# Patient Record
Sex: Male | Born: 1960 | Race: Black or African American | Hispanic: No | Smoking: Former smoker
Health system: Southern US, Community
[De-identification: ages and names within clinical notes are randomized; demographics above are authoritative.]

## PROBLEM LIST (undated history)

## (undated) DIAGNOSIS — J45909 Unspecified asthma, uncomplicated: Secondary | ICD-10-CM

---

## 2011-01-02 ENCOUNTER — Emergency Department: Payer: Self-pay

## 2012-05-02 ENCOUNTER — Emergency Department: Payer: Self-pay | Admitting: Emergency Medicine

## 2012-05-31 ENCOUNTER — Emergency Department: Payer: Self-pay | Admitting: Emergency Medicine

## 2013-04-11 ENCOUNTER — Emergency Department: Payer: Self-pay | Admitting: Emergency Medicine

## 2013-04-12 LAB — CBC
HCT: 45.5 % (ref 40.0–52.0)
HGB: 14.7 g/dL (ref 13.0–18.0)
MCH: 23.7 pg — ABNORMAL LOW (ref 26.0–34.0)
MCHC: 32.3 g/dL (ref 32.0–36.0)
MCV: 74 fL — ABNORMAL LOW (ref 80–100)
Platelet: 201 10*3/uL (ref 150–440)
RBC: 6.19 10*6/uL — ABNORMAL HIGH (ref 4.40–5.90)
WBC: 7.2 10*3/uL (ref 3.8–10.6)

## 2013-04-12 LAB — BASIC METABOLIC PANEL
Anion Gap: 4 — ABNORMAL LOW (ref 7–16)
BUN: 13 mg/dL (ref 7–18)
Calcium, Total: 9.4 mg/dL (ref 8.5–10.1)
Chloride: 106 mmol/L (ref 98–107)
Co2: 29 mmol/L (ref 21–32)
Creatinine: 1.12 mg/dL (ref 0.60–1.30)
EGFR (Non-African Amer.): >60
Glucose: 96 mg/dL (ref 65–99)
Potassium: 4.2 mmol/L (ref 3.5–5.1)
Sodium: 139 mmol/L (ref 136–145)

## 2013-04-12 LAB — TROPONIN I: Troponin-I: 0.04 ng/mL

## 2013-04-17 LAB — CULTURE, BLOOD (SINGLE)

## 2014-02-28 IMAGING — CR DG CHEST 2V
1 series · 2 of 2 positions shown · non-contrast
Comparison: none

REASON FOR EXAM: SOB
COMMENTS:

PROCEDURE:     DXR - DXR CHEST PA (OR AP) AND LATERAL  - May 02, 2012 [DATE]
RESULT:     Comparison: None.

[Series 1: pa · 0.17mm/px · 2 of 2 slices shown]
[im 1/2]
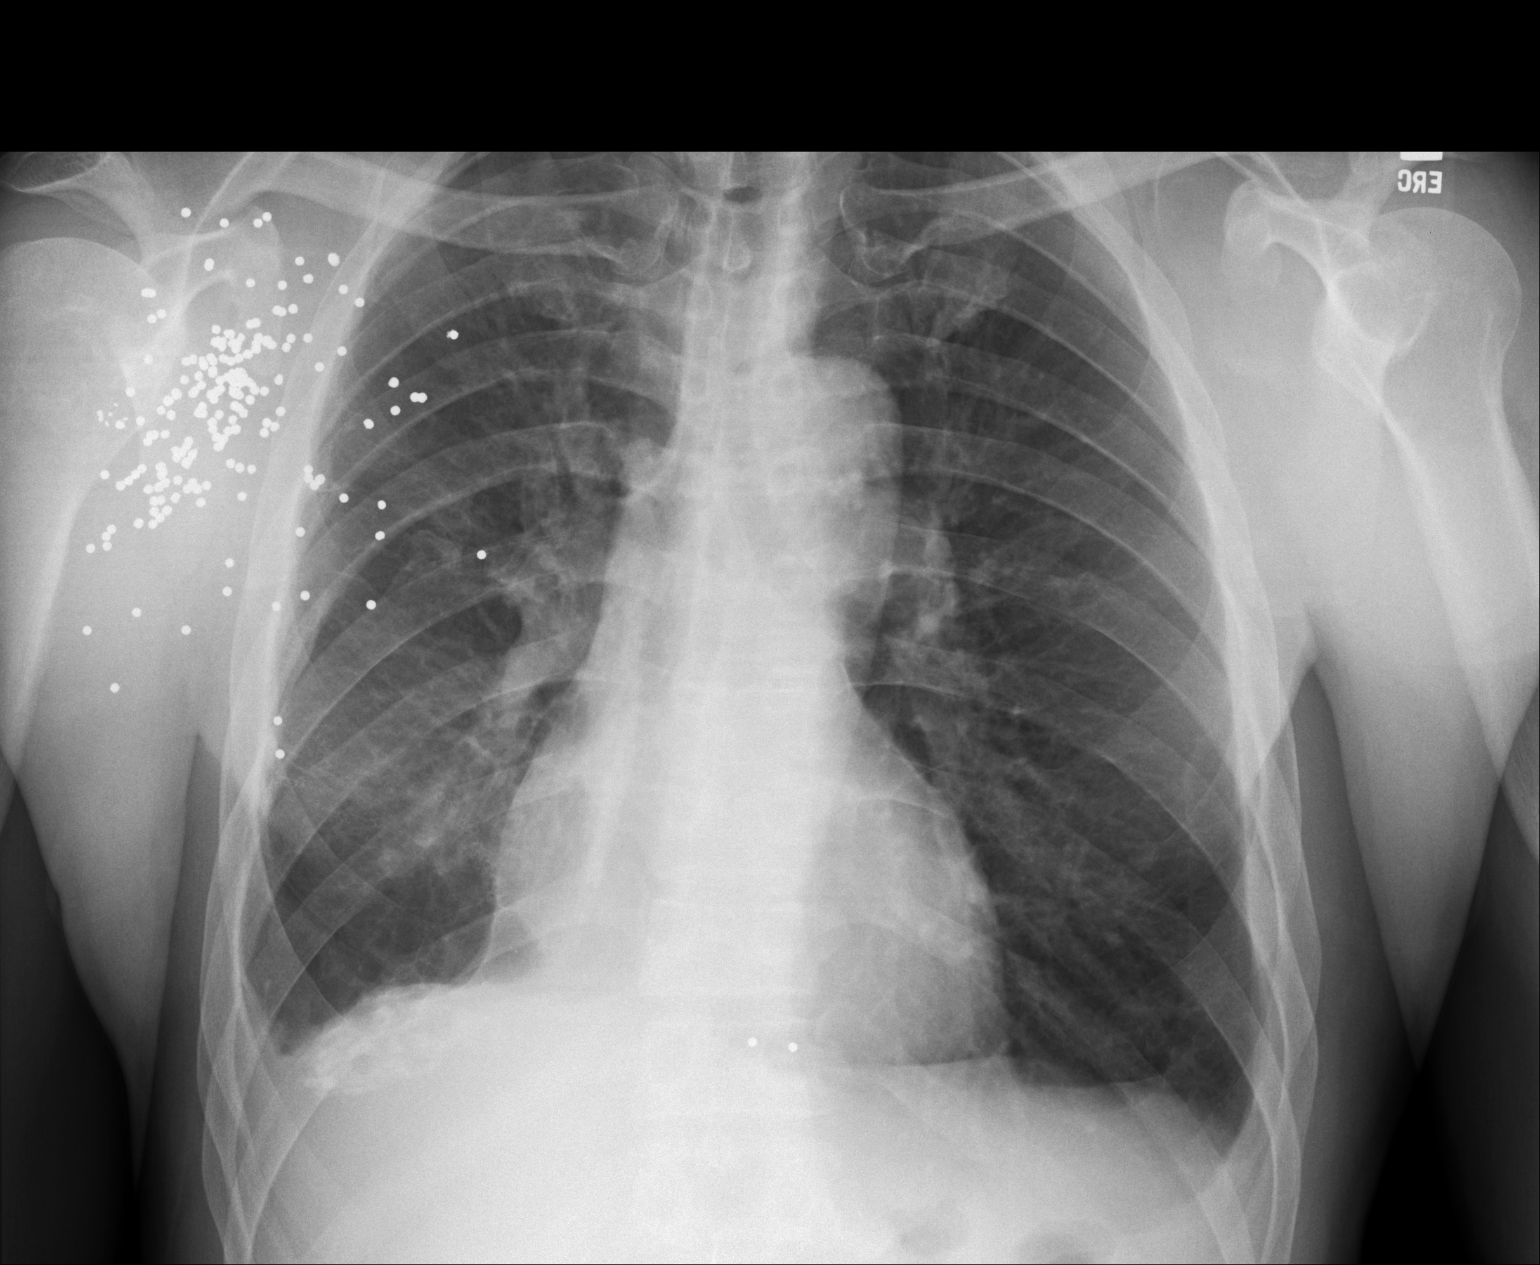
[im 2/2]
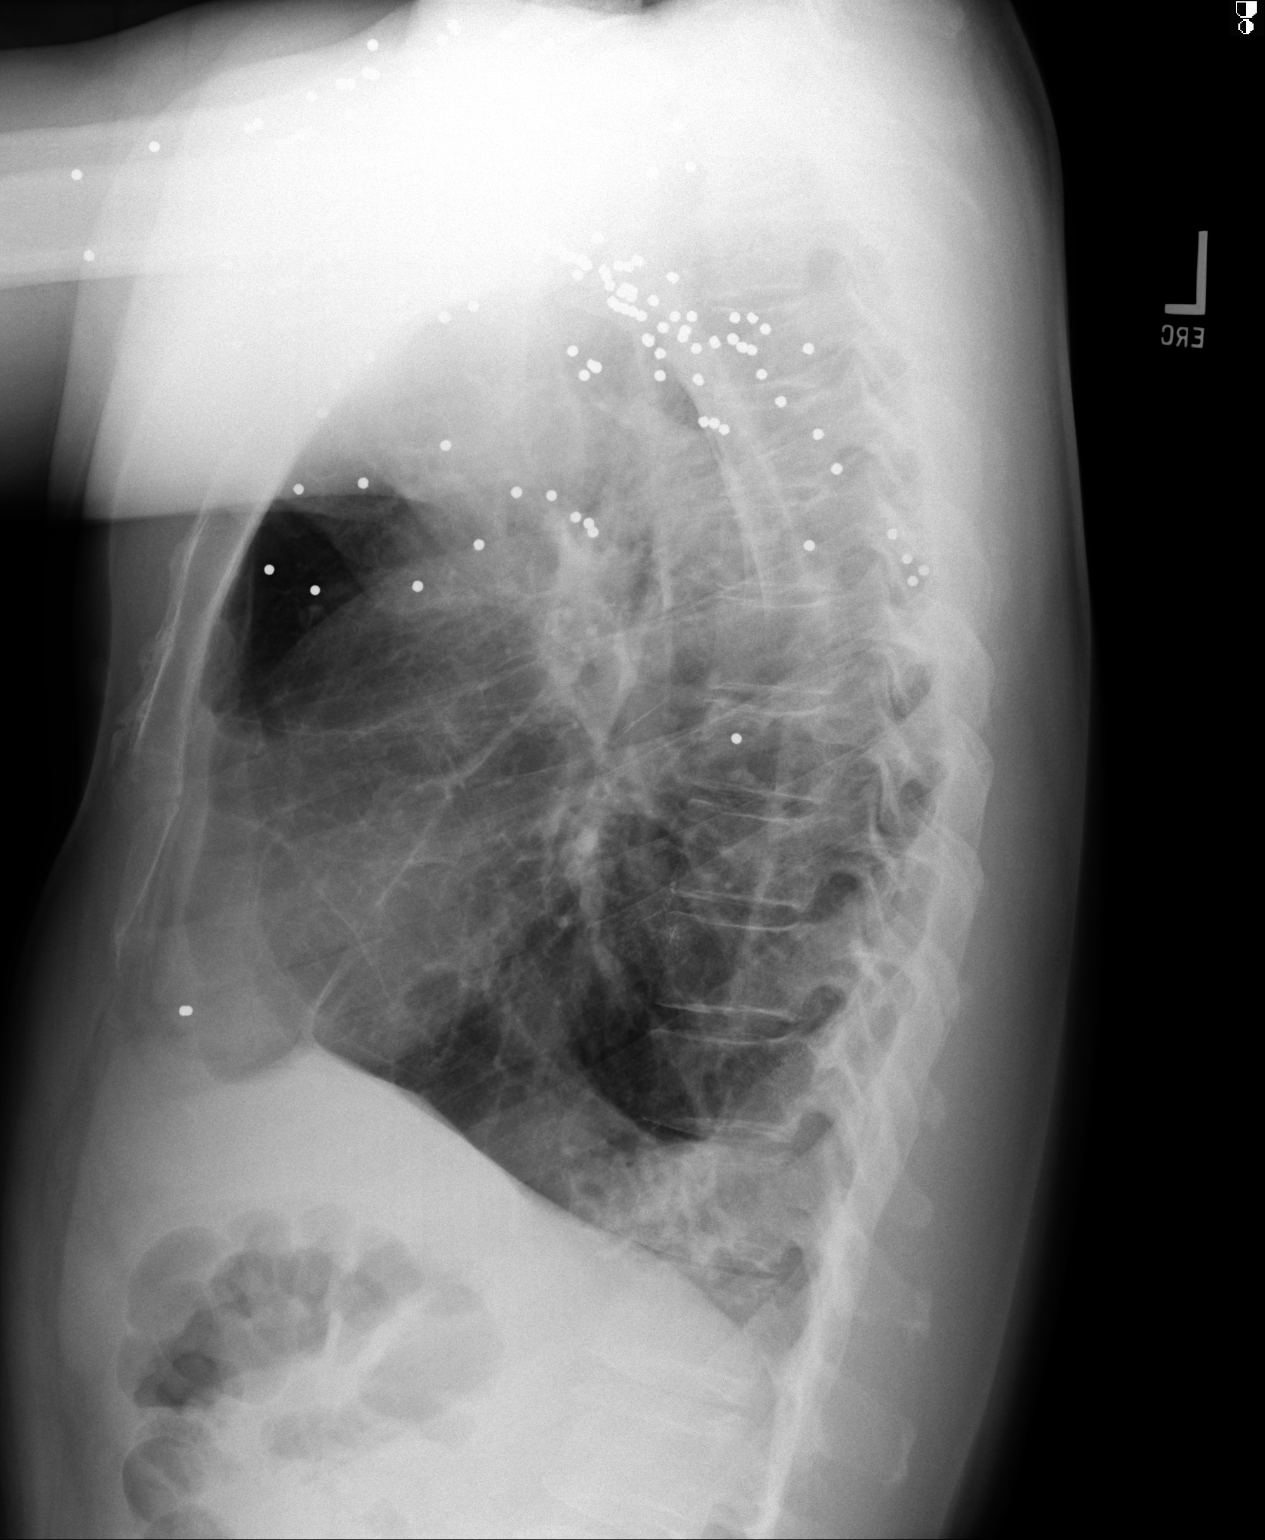

[2 of 2 positions shown; findings below may reference images not displayed]

FINDINGS: The heart is normal in size. There are numerous metallic densities overlying
the right hemithorax and shoulder. There is mild blunting of the right
posterior angle and mild increased density overlying the right
hemidiaphragm. This could the related to some changes of scarring. There are
mild reticular opacities in the right lower lung which may related to
scarring. The left lung is clear.
IMPRESSION: 1. There is mild heterogeneous density along the right hemidiaphragm which
is of uncertain etiology, possibly secondary to scarring or post surgical
change. There is mild adjacent pleural thickening versus small pleural
effusion. Recommend comparison with outside prior chest radiographs to
evaluate for stability of these findings.
2. Mild reticular opacities in the right lower lung may be secondary to
scarring. There appears to be a suture line in this region.
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
## 2015-04-01 ENCOUNTER — Inpatient Hospital Stay
Admit: 2015-04-01 | Discharge: 2015-04-01 | Disposition: A | Discharge status: Home / Self Care | Payer: BLUE CROSS/BLUE SHIELD | Attending: Emergency Medicine

## 2015-04-01 ENCOUNTER — Emergency Department: Admit: 2015-04-01 | Payer: BLUE CROSS/BLUE SHIELD

## 2015-04-01 DIAGNOSIS — J45901 Unspecified asthma with (acute) exacerbation: Secondary | ICD-10-CM

## 2015-04-01 MED ORDER — BENZONATATE 100 MG PO CAPS
100 MG | ORAL_CAPSULE | Freq: Three times a day (TID) | ORAL | 0 refills | Status: AC | PRN
Start: 2015-04-01 — End: 2015-04-08

## 2015-04-01 MED ORDER — PREDNISONE 20 MG PO TABS
20 MG | ORAL_TABLET | Freq: Every day | ORAL | 0 refills | Status: AC
Start: 2015-04-01 — End: 2015-04-06

## 2015-04-01 MED ORDER — ALBUTEROL SULFATE HFA 108 (90 BASE) MCG/ACT IN AERS
108 (90 Base) MCG/ACT | Freq: Four times a day (QID) | RESPIRATORY_TRACT | 3 refills | Status: AC | PRN
Start: 2015-04-01 — End: ?

## 2015-04-01 MED ORDER — METHYLPREDNISOLONE SODIUM SUCC 125 MG IJ SOLR
125 MG | Freq: Once | INTRAMUSCULAR | Status: DC
Start: 2015-04-01 — End: 2015-04-01

## 2015-04-01 MED ORDER — ALBUTEROL SULFATE (2.5 MG/3ML) 0.083% IN NEBU
Freq: Once | RESPIRATORY_TRACT | Status: AC
Start: 2015-04-01 — End: 2015-04-01
  Administered 2015-04-01: 18:00:00 2.5 mg via RESPIRATORY_TRACT

## 2015-04-01 MED ORDER — IPRATROPIUM-ALBUTEROL 0.5-2.5 (3) MG/3ML IN SOLN
Freq: Once | RESPIRATORY_TRACT | Status: AC
Start: 2015-04-01 — End: 2015-04-01
  Administered 2015-04-01: 17:00:00 1 via RESPIRATORY_TRACT

## 2015-04-01 MED ORDER — AZITHROMYCIN 250 MG PO TABS
250 MG | PACK | ORAL | 0 refills | Status: AC
Start: 2015-04-01 — End: 2015-04-11

## 2015-04-01 MED ORDER — METHYLPREDNISOLONE SODIUM SUCC 125 MG IJ SOLR
125 MG | Freq: Once | INTRAMUSCULAR | Status: AC
Start: 2015-04-01 — End: 2015-04-01
  Administered 2015-04-01: 17:00:00 125 mg via INTRAVENOUS

## 2015-04-01 MED FILL — SOLU-MEDROL 125 MG IJ SOLR: 125 MG | INTRAMUSCULAR | Qty: 125

## 2015-04-01 MED FILL — IPRATROPIUM-ALBUTEROL 0.5-2.5 (3) MG/3ML IN SOLN: RESPIRATORY_TRACT | Qty: 3

## 2015-04-01 MED FILL — ALBUTEROL SULFATE (2.5 MG/3ML) 0.083% IN NEBU: RESPIRATORY_TRACT | Qty: 3
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
## 2015-04-01 NOTE — ED Notes (Signed)
Patient placed on 4L of oxygen to raise O2 saturation, it was at 85% on room air, is now 97% on cannula; patient states he has less trouble breathing now.     [address]Mimie Goering Brooke RoscoeDiel, [place]RN  04/01/15 [phone]

## 2015-04-01 NOTE — ED Triage Notes (Signed)
Pt c/o asthma attack since last night, states he is out of his inhaler

## 2015-04-01 NOTE — Discharge Instructions (Signed)
Asthma Attack: Care Instructions  Your Care Instructions     During an asthma attack, the airways swell and narrow. This makes it hard to breathe. Severe asthma attacks can be life-threatening, but you can help prevent them by keeping your asthma under control and treating symptoms before they get bad. Symptoms include being short of breath, having chest tightness, coughing, and wheezing. Noting and treating these symptoms can also help you avoid future trips to the emergency room.  The doctor has checked you carefully, but problems can develop later. If you notice any problems or new symptoms, get medical treatment right away.  Follow-up care is a key part of your treatment and safety. Be sure to make and go to all appointments, and call your doctor if you are having problems. It's also a good idea to know your test results and keep a list of the medicines you take.  How can you care for yourself at home?   Follow your asthma action plan to prevent and treat attacks. If you don't have an asthma action plan, work with your doctor to create one.   Take your asthma medicines exactly as prescribed. Talk to your doctor right away if you have any questions about how to take them.   Use your quick-relief medicine when you have symptoms of an attack. Quick-relief medicine is usually an albuterol inhaler. Some people need to use quick-relief medicine before they exercise.   Take your controller medicine every day, not just when you have symptoms. Controller medicine is usually an inhaled corticosteroid. The goal is to prevent problems before they occur. Don't use your controller medicine to treat an attack that has already started. It doesn't work fast enough to help.   If your doctor prescribed corticosteroid pills to use during an attack, take them exactly as prescribed. It may take hours for the pills to work, but they may make the episode shorter and help you breathe better.   Keep your quick-relief medicine  with you at all times.   Talk to your doctor before using other medicines. Some medicines, such as aspirin, can cause asthma attacks in some people.   If you have a peak flow meter, use it to check how well you are breathing. This can help you predict when an asthma attack is going to occur. Then you can take medicine to prevent the asthma attack or make it less severe.   Do not smoke or allow others to smoke around you. Avoid smoky places. Smoking makes asthma worse. If you need help quitting, talk to your doctor about stop-smoking programs and medicines. These can increase your chances of quitting for good.   Learn what triggers an asthma attack for you, and avoid the triggers when you can. Common triggers include colds, smoke, air pollution, dust, pollen, mold, pets, cockroaches, stress, and cold air.   Avoid colds and the flu. Get a pneumococcal vaccine shot. If you have had one before, ask your doctor if you need a second dose. Get a flu vaccine every fall. If you must be around people with colds or the flu, wash your hands often.  When should you call for help?  Call 911 anytime you think you may need emergency care. For example, call if:   You have severe trouble breathing.  Call your doctor now or seek immediate medical care if:   Your symptoms do not get better after you have followed your asthma action plan.   You have new or worse   trouble breathing.   Your coughing and wheezing get worse.   You cough up dark brown or bloody mucus (sputum).   You have a new or higher fever.  Watch closely for changes in your health, and be sure to contact your doctor if:   You need to use quick-relief medicine on more than 2 days a week (unless it is just for exercise).   You cough more deeply or more often, especially if you notice more mucus or a change in the color of your mucus.   You are not getting better as expected.  Where can you learn more?  Go to [email or web address] and sign in  to your MyChart account. Enter F084 in the Search Health Information box to learn more about "Asthma Attack: Care Instructions."    If you do not have an account, please click on the "Sign Up Now" link.   2006-2016 [employer]. Care instructions adapted under license by [facility]. This care instruction is for use with your licensed healthcare professional. If you have questions about a medical condition or this instruction, always ask your healthcare professional. [employer] disclaims any warranty or liability for your use of this information.  Content Version: 11.0.578772; Current as of: Sep 05, 2014

## 2018-11-29 ENCOUNTER — Other Ambulatory Visit: Payer: Self-pay

## 2018-11-29 ENCOUNTER — Encounter: Payer: Self-pay | Admitting: Intensive Care

## 2018-11-29 ENCOUNTER — Emergency Department: Payer: Self-pay

## 2018-11-29 ENCOUNTER — Inpatient Hospital Stay
Admission: EM | Admit: 2018-11-29 | Discharge: 2018-12-03 | DRG: 189 | Disposition: A | Discharge status: Home / Self Care | Payer: Self-pay | Attending: Internal Medicine | Admitting: Internal Medicine

## 2018-11-29 DIAGNOSIS — J9601 Acute respiratory failure with hypoxia: Principal | ICD-10-CM | POA: Diagnosis present

## 2018-11-29 DIAGNOSIS — I10 Essential (primary) hypertension: Secondary | ICD-10-CM | POA: Diagnosis present

## 2018-11-29 DIAGNOSIS — I959 Hypotension, unspecified: Secondary | ICD-10-CM | POA: Diagnosis present

## 2018-11-29 DIAGNOSIS — J441 Chronic obstructive pulmonary disease with (acute) exacerbation: Secondary | ICD-10-CM | POA: Diagnosis present

## 2018-11-29 DIAGNOSIS — Z885 Allergy status to narcotic agent status: Secondary | ICD-10-CM

## 2018-11-29 DIAGNOSIS — Z888 Allergy status to other drugs, medicaments and biological substances status: Secondary | ICD-10-CM

## 2018-11-29 DIAGNOSIS — Z880 Allergy status to penicillin: Secondary | ICD-10-CM

## 2018-11-29 DIAGNOSIS — E875 Hyperkalemia: Secondary | ICD-10-CM | POA: Diagnosis present

## 2018-11-29 DIAGNOSIS — Z20828 Contact with and (suspected) exposure to other viral communicable diseases: Secondary | ICD-10-CM | POA: Diagnosis present

## 2018-11-29 HISTORY — DX: Unspecified asthma, uncomplicated: J45.909

## 2018-11-29 LAB — CBC WITH DIFFERENTIAL/PLATELET
Abs Immature Granulocytes: 0.01 10*3/uL (ref 0.00–0.07)
Basophils Absolute: 0 10*3/uL (ref 0.0–0.1)
Basophils Relative: 1 %
Eosinophils Absolute: 0.3 10*3/uL (ref 0.0–0.5)
Eosinophils Relative: 4 %
HCT: 52.3 % — ABNORMAL HIGH (ref 39.0–52.0)
Hemoglobin: 15.3 g/dL (ref 13.0–17.0)
Immature Granulocytes: 0 %
Lymphocytes Relative: 12 %
Lymphs Abs: 0.8 10*3/uL (ref 0.7–4.0)
MCH: 23 pg — ABNORMAL LOW (ref 26.0–34.0)
MCHC: 29.3 g/dL — ABNORMAL LOW (ref 30.0–36.0)
MCV: 78.8 fL — ABNORMAL LOW (ref 80.0–100.0)
Monocytes Absolute: 0.8 10*3/uL (ref 0.1–1.0)
Monocytes Relative: 12 %
Neutro Abs: 4.5 10*3/uL (ref 1.7–7.7)
Neutrophils Relative %: 71 %
Platelets: 218 10*3/uL (ref 150–400)
RBC: 6.64 MIL/uL — ABNORMAL HIGH (ref 4.22–5.81)
RDW: 19.2 % — ABNORMAL HIGH (ref 11.5–15.5)
WBC: 6.4 10*3/uL (ref 4.0–10.5)
nRBC: 0 % (ref 0.0–0.2)

## 2018-11-29 LAB — COMPREHENSIVE METABOLIC PANEL
ALT: 14 U/L (ref 0–44)
AST: 15 U/L (ref 15–41)
Albumin: 4.3 g/dL (ref 3.5–5.0)
Alkaline Phosphatase: 62 U/L (ref 38–126)
Anion gap: 9 (ref 5–15)
BUN: 11 mg/dL (ref 6–20)
CO2: 31 mmol/L (ref 22–32)
Calcium: 9 mg/dL (ref 8.9–10.3)
Chloride: 99 mmol/L (ref 98–111)
Creatinine, Ser: 0.92 mg/dL (ref 0.61–1.24)
GFR calc Af Amer: >60 mL/min (ref >60)
GFR calc non Af Amer: >60 mL/min (ref >60)
Glucose, Bld: 105 mg/dL — ABNORMAL HIGH (ref 70–99)
Potassium: 4.2 mmol/L (ref 3.5–5.1)
Sodium: 139 mmol/L (ref 135–145)
Total Bilirubin: 0.5 mg/dL (ref 0.3–1.2)
Total Protein: 7.2 g/dL (ref 6.5–8.1)

## 2018-11-29 LAB — BRAIN NATRIURETIC PEPTIDE: B Natriuretic Peptide: 63 pg/mL (ref 0.0–100.0)

## 2018-11-29 LAB — TROPONIN I (HIGH SENSITIVITY)
Troponin I (High Sensitivity): 8 ng/L (ref <18)
Troponin I (High Sensitivity): 9 ng/L (ref <18)

## 2018-11-29 LAB — PROCALCITONIN: Procalcitonin: <0.1 ng/mL

## 2018-11-29 LAB — LACTIC ACID, PLASMA: Lactic Acid, Venous: 0.8 mmol/L (ref 0.5–1.9)

## 2018-11-29 LAB — SARS CORONAVIRUS 2 BY RT PCR (HOSPITAL ORDER, PERFORMED IN ~~LOC~~ HOSPITAL LAB): SARS Coronavirus 2: NEGATIVE

## 2018-11-29 MED ORDER — ONDANSETRON HCL 4 MG/2ML IJ SOLN
4.0000 mg | Freq: Four times a day (QID) | INTRAMUSCULAR | Status: DC | PRN
  Administered 2018-11-30 (×2): 4 mg via INTRAVENOUS
  Filled 2018-11-29 (×2): qty 2

## 2018-11-29 MED ORDER — SODIUM CHLORIDE 0.9 % IV SOLN
500.0000 mg | Freq: Once | INTRAVENOUS | Status: AC
  Administered 2018-11-29: 500 mg via INTRAVENOUS
  Filled 2018-11-29: qty 500

## 2018-11-29 MED ORDER — ACETAMINOPHEN 650 MG RE SUPP: 650.0000 mg | Freq: Four times a day (QID) | RECTAL | Status: DC | PRN

## 2018-11-29 MED ORDER — UMECLIDINIUM BROMIDE 62.5 MCG/INH IN AEPB
1.0000 | INHALATION_SPRAY | Freq: Every day | RESPIRATORY_TRACT | Status: DC
  Administered 2018-11-30 – 2018-12-03 (×4): 1 via RESPIRATORY_TRACT
  Filled 2018-11-29: qty 7

## 2018-11-29 MED ORDER — IPRATROPIUM-ALBUTEROL 0.5-2.5 (3) MG/3ML IN SOLN
3.0000 mL | Freq: Four times a day (QID) | RESPIRATORY_TRACT | Status: DC
  Administered 2018-11-29 – 2018-12-02 (×12): 3 mL via RESPIRATORY_TRACT
  Filled 2018-11-29 (×12): qty 3

## 2018-11-29 MED ORDER — ACETAMINOPHEN 325 MG PO TABS
650.0000 mg | ORAL_TABLET | Freq: Four times a day (QID) | ORAL | Status: DC | PRN
  Administered 2018-12-01: 650 mg via ORAL
  Filled 2018-11-29: qty 2

## 2018-11-29 MED ORDER — AZITHROMYCIN 250 MG PO TABS
500.0000 mg | ORAL_TABLET | Freq: Every day | ORAL | Status: DC
  Administered 2018-11-30 – 2018-12-03 (×4): 500 mg via ORAL
  Filled 2018-11-29 (×4): qty 2

## 2018-11-29 MED ORDER — BRIMONIDINE TARTRATE 0.2 % OP SOLN
1.0000 [drp] | Freq: Two times a day (BID) | OPHTHALMIC | Status: DC
  Administered 2018-12-03: 1 [drp] via OPHTHALMIC
  Filled 2018-11-29 (×3): qty 5

## 2018-11-29 MED ORDER — POLYETHYLENE GLYCOL 3350 17 G PO PACK: 17.0000 g | PACK | Freq: Every day | ORAL | Status: DC | PRN

## 2018-11-29 MED ORDER — SODIUM CHLORIDE 0.9 % IV SOLN
2.0000 g | Freq: Once | INTRAVENOUS | Status: AC
  Administered 2018-11-29: 2 g via INTRAVENOUS
  Filled 2018-11-29: qty 20

## 2018-11-29 MED ORDER — MONTELUKAST SODIUM 10 MG PO TABS
10.0000 mg | ORAL_TABLET | Freq: Every evening | ORAL | Status: DC
  Administered 2018-11-29 – 2018-12-03 (×4): 10 mg via ORAL
  Filled 2018-11-29 (×5): qty 1

## 2018-11-29 MED ORDER — IPRATROPIUM-ALBUTEROL 0.5-2.5 (3) MG/3ML IN SOLN
3.0000 mL | Freq: Once | RESPIRATORY_TRACT | Status: AC
  Administered 2018-11-29: 14:00:00 3 mL via RESPIRATORY_TRACT
  Filled 2018-11-29: qty 3

## 2018-11-29 MED ORDER — ALBUTEROL SULFATE (2.5 MG/3ML) 0.083% IN NEBU
5.0000 mg | INHALATION_SOLUTION | Freq: Once | RESPIRATORY_TRACT | Status: AC
  Administered 2018-11-29: 5 mg via RESPIRATORY_TRACT
  Filled 2018-11-29: qty 6

## 2018-11-29 MED ORDER — ONDANSETRON HCL 4 MG PO TABS: 4.0000 mg | ORAL_TABLET | Freq: Four times a day (QID) | ORAL | Status: DC | PRN

## 2018-11-29 MED ORDER — FLUTICASONE-UMECLIDIN-VILANT 100-62.5-25 MCG/INH IN AEPB: 1.0000 | INHALATION_SPRAY | Freq: Every day | RESPIRATORY_TRACT | Status: DC

## 2018-11-29 MED ORDER — LATANOPROST 0.005 % OP SOLN
1.0000 [drp] | Freq: Every day | OPHTHALMIC | Status: DC
  Administered 2018-11-30 – 2018-12-02 (×3): 1 [drp] via OPHTHALMIC
  Filled 2018-11-29 (×2): qty 2.5

## 2018-11-29 MED ORDER — FUROSEMIDE 10 MG/ML IJ SOLN
20.0000 mg | Freq: Once | INTRAMUSCULAR | Status: AC
  Administered 2018-11-29: 20 mg via INTRAVENOUS
  Filled 2018-11-29: qty 4

## 2018-11-29 MED ORDER — FLUTICASONE FUROATE-VILANTEROL 100-25 MCG/INH IN AEPB
1.0000 | INHALATION_SPRAY | Freq: Every day | RESPIRATORY_TRACT | Status: DC
  Administered 2018-11-30 – 2018-12-03 (×4): 1 via RESPIRATORY_TRACT
  Filled 2018-11-29: qty 28

## 2018-11-29 MED ORDER — ALBUTEROL SULFATE (2.5 MG/3ML) 0.083% IN NEBU: 2.5000 mg | INHALATION_SOLUTION | RESPIRATORY_TRACT | Status: DC | PRN

## 2018-11-29 MED ORDER — METHYLPREDNISOLONE SODIUM SUCC 125 MG IJ SOLR
60.0000 mg | Freq: Two times a day (BID) | INTRAMUSCULAR | Status: DC
  Administered 2018-11-30 – 2018-12-02 (×6): 60 mg via INTRAVENOUS
  Filled 2018-11-29 (×6): qty 2

## 2018-11-29 MED ORDER — ENOXAPARIN SODIUM 40 MG/0.4ML ~~LOC~~ SOLN
40.0000 mg | SUBCUTANEOUS | Status: DC
  Administered 2018-11-29 – 2018-12-02 (×4): 40 mg via SUBCUTANEOUS
  Filled 2018-11-29 (×4): qty 0.4

## 2018-11-29 MED ORDER — METHYLPREDNISOLONE SODIUM SUCC 125 MG IJ SOLR
125.0000 mg | Freq: Once | INTRAMUSCULAR | Status: AC
  Administered 2018-11-29: 125 mg via INTRAVENOUS
  Filled 2018-11-29: qty 2
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
## 2018-11-29 NOTE — ED Triage Notes (Signed)
Patient c/o cough, wheezing, headache, and sob since yesterday. O2 sats in triage 78%

## 2018-11-29 NOTE — ED Provider Notes (Addendum)
[facility] Emergency Department Provider Note  ____________________________________________  Time seen: Approximately 2:20 PM  I have reviewed the triage vital signs and the nursing notes.   HISTORY  Chief Complaint Cough, Wheezing, and Headache    HPI Ryan Harrison is a 58 y.o. male with a history of COPD who comes the ED complaining of 2 days worth of worsening shortness of breath associated with nonproductive cough.  No fevers chills body aches or sweats.  No sick contacts.  No chest pain.  Tried using his inhalers without relief.  Symptoms are constant, worse with exertion, no alleviating factors.  Patient works as a Magazine features editor.  He reports some chronic pedal edema that is dependent and gets better with leg elevation at night.      Past Medical History:  Diagnosis Date  . Asthma   COPD   There are no active problems to display for this patient.    History reviewed. No pertinent surgical history.   Prior to Admission medications   Not on File  Albuterol inhaler   Allergies Benadryl [diphenhydramine] and Penicillins   History reviewed. No pertinent family history.  Social History Social History   Tobacco Use  . Smoking status: Never Smoker  . Smokeless tobacco: Never Used  Substance Use Topics  . Alcohol use: Not Currently  . Drug use: Never    Review of Systems  Constitutional:   No fever or chills.  ENT:   No sore throat. No rhinorrhea. Cardiovascular:   No chest pain or syncope. Respiratory:   Positive shortness of breath and nonproductive cough. Gastrointestinal:   Negative for abdominal pain, vomiting and diarrhea.  Musculoskeletal:   Negative for focal pain, positive chronic pedal edema bilaterally All other systems reviewed and are negative except as documented above in ROS and HPI.  ____________________________________________   PHYSICAL EXAM:  VITAL SIGNS: ED Triage Vitals  Enc Vitals Group      BP 11/29/18 1304 (!) 147/80     Pulse Rate 11/29/18 1304 100     Resp 11/29/18 1304 (!) 28     Temp 11/29/18 1304 98.2 F (36.8 C)     Temp Source 11/29/18 1304 Oral     SpO2 11/29/18 1304 (!) 78 %     Weight 11/29/18 1305 189 lb (85.7 kg)     Height 11/29/18 1305 6\' 2"  (1.88 m)     Head Circumference --      Peak Flow --      Pain Score 11/29/18 1305 6     Pain Loc --      Pain Edu? --      Excl. in [place]? --     Vital signs reviewed, nursing assessments reviewed.   Constitutional:   Alert and oriented. Non-toxic appearance. Eyes:   Conjunctivae are normal. EOMI. PERRL. ENT      Head:   Normocephalic and atraumatic.      Nose:   No congestion/rhinnorhea.       Mouth/Throat:   MMM, no pharyngeal erythema. No peritonsillar mass.       Neck:   No meningismus. Full ROM. Hematological/Lymphatic/Immunilogical:   No cervical lymphadenopathy. Cardiovascular:   RRR. Symmetric bilateral radial and DP pulses.  No murmurs. Cap refill less than 2 seconds. Respiratory:   Tachypnea.  No accessory muscle use.  Diffuse expiratory wheezing.  Markedly prolonged expiratory phase.  No focal crackles. Gastrointestinal:   Soft and nontender. Non distended. There is no CVA tenderness.  No  rebound, rigidity, or guarding.  Musculoskeletal:   Normal range of motion in all extremities. No joint effusions.  No lower extremity tenderness.  1+ pitting edema at bilateral ankles.  Calves nontender, symmetric calf circumference, negative Homans sign.  No palpable cord.. Neurologic:   Normal speech and language.  Motor grossly intact. No acute focal neurologic deficits are appreciated.  Skin:    Skin is warm, dry and intact. No rash noted.  No petechiae, purpura, or bullae.  ____________________________________________    LABS (pertinent positives/negatives) (all labs ordered are listed, but only abnormal results are displayed) Labs Reviewed  COMPREHENSIVE METABOLIC PANEL - Abnormal; Notable for the  following components:      Result Value   Glucose, Bld 105 (*)    All other components within normal limits  CBC WITH DIFFERENTIAL/PLATELET - Abnormal; Notable for the following components:   RBC 6.64 (*)    HCT 52.3 (*)    MCV 78.8 (*)    MCH 23.0 (*)    MCHC 29.3 (*)    RDW 19.2 (*)    All other components within normal limits  SARS CORONAVIRUS 2 (HOSPITAL ORDER, PERFORMED IN [facility])  LACTIC ACID, PLASMA  LACTIC ACID, PLASMA  TROPONIN I (HIGH SENSITIVITY)   ____________________________________________   EKG Interpreted by me Sinus rhythm rate of 91, normal axis intervals QRS ST segments and T waves.   ____________________________________________    RADIOLOGY  No results found.  ____________________________________________   PROCEDURES .Critical Care Performed by: Sharman CheekStafford, Tanyla Stege, MD Authorized by: Sharman CheekStafford, Shawanda Sievert, MD   Critical care provider statement:    Critical care time (minutes):  30   Critical care time was exclusive of:  Separately billable procedures and treating other patients   Critical care was necessary to treat or prevent imminent or life-threatening deterioration of the following conditions:  Respiratory failure   Critical care was time spent personally by me on the following activities:  Development of treatment plan with patient or surrogate, discussions with consultants, evaluation of patient's response to treatment, examination of patient, obtaining history from patient or surrogate, ordering and performing treatments and interventions, ordering and review of laboratory studies, ordering and review of radiographic studies, pulse oximetry, re-evaluation of patient's condition and review of old charts    ____________________________________________  DIFFERENTIAL DIAGNOSIS   Pneumonia, COPD exacerbation, COVID-19, pulmonary embolism  CLINICAL IMPRESSION / ASSESSMENT AND PLAN / ED COURSE  Medications ordered in the  ED: Medications  cefTRIAXone (ROCEPHIN) 2 g in sodium chloride 0.9 % 100 mL IVPB (2 g Intravenous New Bag/Given 11/29/18 1408)  azithromycin (ZITHROMAX) 500 mg in sodium chloride 0.9 % 250 mL IVPB (has no administration in time range)  methylPREDNISolone sodium succinate (SOLU-MEDROL) 125 mg/2 mL injection 125 mg (125 mg Intravenous Given 11/29/18 1403)  ipratropium-albuterol (DUONEB) 0.5-2.5 (3) MG/3ML nebulizer solution 3 mL (3 mLs Nebulization Given 11/29/18 1416)  albuterol (PROVENTIL) (2.5 MG/3ML) 0.083% nebulizer solution 5 mg (5 mg Nebulization Given 11/29/18 1416)    Pertinent labs & imaging results that were available during my care of the patient were reviewed by me and considered in my medical decision making (see chart for details).  Campbell RichesFrederick L Fern was evaluated in Emergency Department on 11/29/2018 for the symptoms described in the history of present illness. He was evaluated in the context of the global COVID-19 pandemic, which necessitated consideration that the patient might be at risk for infection with the SARS-CoV-2 virus that causes COVID-19. Institutional protocols and algorithms that pertain to  the evaluation of patients at risk for COVID-19 are in a state of rapid change based on information released by regulatory bodies including the CDC and federal and state organizations. These policies and algorithms were followed during the patient's care in the ED.   Patient presents with shortness of breath, nonproductive cough and pulmonary exam findings prolonged expiratory phase and wheezing all consistent with COPD exacerbation.  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  Stabilized on nasal cannula oxygen.  Will give Solu-Medrol and nebs.  If work-up is not confirming the clinical exam diagnosis of COPD or COVID, patient may need CT angiogram to evaluate for PE.  Will plan for admission due to his acute hypoxic respiratory  failure.  ----------------------------------------- 2:56 PM on 11/29/2018 -----------------------------------------  cxr image viewed by me, shows hazy infiltrates in right lower lung.  Radiology read describes possible pulmonary edema, but appearance could be due to viral pneumonitis as well.  Presentation is not consistent with PE and would not proceed with CT scan.  Hospitalist paged for further management.  I will add on IV Lasix 20 mg for initial diuresis in the setting of his pedal edema..  Troponin is unremarkable.     ____________________________________________   FINAL CLINICAL IMPRESSION(S) / ED DIAGNOSES    Final diagnoses:  Acute respiratory failure with hypoxia (HCC)  COPD exacerbation [facility](HCC)     ED Discharge Orders    None      Portions of this note were generated with dragon dictation software. Dictation errors may occur despite best attempts at proofreading.   Sharman CheekStafford, Justyce Baby, MD 11/29/18 1429    Sharman CheekStafford, Munachimso Rigdon, MD 11/29/18 1457    Sharman CheekStafford, Kooper Chriswell, MD 11/29/18 [phone]

## 2018-11-30 LAB — BASIC METABOLIC PANEL
Anion gap: 15 (ref 5–15)
BUN: 15 mg/dL (ref 6–20)
CO2: 42 mmol/L — ABNORMAL HIGH (ref 22–32)
Calcium: 9.5 mg/dL (ref 8.9–10.3)
Chloride: 85 mmol/L — ABNORMAL LOW (ref 98–111)
Creatinine, Ser: 1.17 mg/dL (ref 0.61–1.24)
GFR calc Af Amer: >60 mL/min (ref >60)
GFR calc non Af Amer: >60 mL/min (ref >60)
Glucose, Bld: 126 mg/dL — ABNORMAL HIGH (ref 70–99)
Potassium: 6.1 mmol/L — ABNORMAL HIGH (ref 3.5–5.1)
Sodium: 142 mmol/L (ref 135–145)

## 2018-11-30 LAB — CBC
HCT: 58.6 % — ABNORMAL HIGH (ref 39.0–52.0)
Hemoglobin: 16.4 g/dL (ref 13.0–17.0)
MCH: 22.9 pg — ABNORMAL LOW (ref 26.0–34.0)
MCHC: 28 g/dL — ABNORMAL LOW (ref 30.0–36.0)
MCV: 81.8 fL (ref 80.0–100.0)
Platelets: 236 10*3/uL (ref 150–400)
RBC: 7.16 MIL/uL — ABNORMAL HIGH (ref 4.22–5.81)
RDW: 19.9 % — ABNORMAL HIGH (ref 11.5–15.5)
WBC: 6.4 10*3/uL (ref 4.0–10.5)
nRBC: 0 % (ref 0.0–0.2)

## 2018-11-30 LAB — POTASSIUM
Potassium: 6.4 mmol/L (ref 3.5–5.1)
Potassium: 5.4 mmol/L — ABNORMAL HIGH (ref 3.5–5.1)

## 2018-11-30 MED ORDER — SODIUM POLYSTYRENE SULFONATE 15 GM/60ML PO SUSP
30.0000 g | Freq: Once | ORAL | Status: AC
  Administered 2018-11-30: 30 g via ORAL
  Filled 2018-11-30: qty 120

## 2018-11-30 MED ORDER — CALCIUM GLUCONATE-NACL 1-0.675 GM/50ML-% IV SOLN
1.0000 g | Freq: Once | INTRAVENOUS | Status: AC
  Administered 2018-11-30: 1000 mg via INTRAVENOUS
  Filled 2018-11-30: qty 50

## 2018-11-30 MED ORDER — SODIUM CHLORIDE 0.9 % IV SOLN
INTRAVENOUS | Status: DC | PRN
  Administered 2018-11-30: 09:00:00 250 mL via INTRAVENOUS

## 2018-11-30 MED ORDER — SODIUM BICARBONATE 8.4 % IV SOLN
50.0000 meq | Freq: Once | INTRAVENOUS | Status: AC
  Administered 2018-11-30: 50 meq via INTRAVENOUS
  Filled 2018-11-30: qty 50

## 2018-11-30 MED ORDER — SODIUM ZIRCONIUM CYCLOSILICATE 10 G PO PACK
10.0000 g | PACK | Freq: Once | ORAL | Status: AC
  Administered 2018-11-30: 10 g via ORAL
  Filled 2018-11-30: qty 1

## 2018-11-30 MED ORDER — DEXTROSE 50 % IV SOLN
50.0000 mL | Freq: Once | INTRAVENOUS | Status: AC
  Administered 2018-11-30: 50 mL via INTRAVENOUS
  Filled 2018-11-30: qty 50

## 2018-11-30 MED ORDER — INSULIN REGULAR BOLUS VIA INFUSION
10.0000 [IU] | Freq: Once | INTRAVENOUS | Status: AC
  Administered 2018-11-30: 10 [IU] via INTRAVENOUS
  Filled 2018-11-30 (×3): qty 10

## 2018-11-30 MED ORDER — ALUM & MAG HYDROXIDE-SIMETH 200-200-20 MG/5ML PO SUSP
30.0000 mL | Freq: Four times a day (QID) | ORAL | Status: DC | PRN
  Administered 2018-11-30: 30 mL via ORAL
  Filled 2018-11-30: qty 30
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
## 2018-12-01 ENCOUNTER — Inpatient Hospital Stay (HOSPITAL_COMMUNITY)
Admit: 2018-12-01 | Discharge: 2018-12-01 | Disposition: A | Discharge status: Home / Self Care | Payer: PRIVATE HEALTH INSURANCE | Attending: Internal Medicine | Admitting: Internal Medicine

## 2018-12-01 DIAGNOSIS — R0602 Shortness of breath: Secondary | ICD-10-CM | POA: Diagnosis not present

## 2018-12-01 LAB — CBC
HCT: 51.7 % (ref 39.0–52.0)
Hemoglobin: 14.1 g/dL (ref 13.0–17.0)
MCH: 22.9 pg — ABNORMAL LOW (ref 26.0–34.0)
MCHC: 27.3 g/dL — ABNORMAL LOW (ref 30.0–36.0)
MCV: 84.1 fL (ref 80.0–100.0)
Platelets: 209 10*3/uL (ref 150–400)
RBC: 6.15 MIL/uL — ABNORMAL HIGH (ref 4.22–5.81)
RDW: 18.9 % — ABNORMAL HIGH (ref 11.5–15.5)
WBC: 10.4 10*3/uL (ref 4.0–10.5)
nRBC: 0.2 % (ref 0.0–0.2)

## 2018-12-01 LAB — BASIC METABOLIC PANEL
Anion gap: 6 (ref 5–15)
BUN: 22 mg/dL — ABNORMAL HIGH (ref 6–20)
CO2: 39 mmol/L — ABNORMAL HIGH (ref 22–32)
Calcium: 8.8 mg/dL — ABNORMAL LOW (ref 8.9–10.3)
Chloride: 91 mmol/L — ABNORMAL LOW (ref 98–111)
Creatinine, Ser: 0.91 mg/dL (ref 0.61–1.24)
GFR calc Af Amer: >60 mL/min (ref >60)
GFR calc non Af Amer: >60 mL/min (ref >60)
Glucose, Bld: 132 mg/dL — ABNORMAL HIGH (ref 70–99)
Potassium: 5.5 mmol/L — ABNORMAL HIGH (ref 3.5–5.1)
Sodium: 136 mmol/L (ref 135–145)

## 2018-12-01 LAB — HIV ANTIBODY (ROUTINE TESTING W REFLEX): HIV Screen 4th Generation wRfx: NONREACTIVE

## 2018-12-01 LAB — ECHOCARDIOGRAM COMPLETE
Height: 74 in
Weight: 3024 oz

## 2018-12-01 LAB — MAGNESIUM: Magnesium: 2.4 mg/dL (ref 1.7–2.4)

## 2018-12-01 MED ORDER — BRINZOLAMIDE 1 % OP SUSP
1.0000 [drp] | Freq: Two times a day (BID) | OPHTHALMIC | Status: DC
  Administered 2018-12-01 – 2018-12-03 (×4): 1 [drp] via OPHTHALMIC
  Filled 2018-12-01: qty 10

## 2018-12-01 MED ORDER — TRAZODONE HCL 100 MG PO TABS
100.0000 mg | ORAL_TABLET | Freq: Every day | ORAL | Status: DC
  Administered 2018-12-01 – 2018-12-02 (×2): 100 mg via ORAL
  Filled 2018-12-01 (×2): qty 1

## 2018-12-01 MED ORDER — BRIMONIDINE TARTRATE 0.2 % OP SOLN
1.0000 [drp] | Freq: Two times a day (BID) | OPHTHALMIC | Status: DC
  Administered 2018-12-03: 1 [drp] via OPHTHALMIC
  Filled 2018-12-01 (×2): qty 5

## 2018-12-01 MED ORDER — SODIUM ZIRCONIUM CYCLOSILICATE 10 G PO PACK
10.0000 g | PACK | Freq: Two times a day (BID) | ORAL | Status: AC
  Administered 2018-12-01 (×2): 10 g via ORAL
  Filled 2018-12-01 (×2): qty 1

## 2018-12-01 NOTE — Progress Notes (Signed)
*  PRELIMINARY RESULTS* Echocardiogram 2D Echocardiogram has been performed.  Ryan Harrison 12/01/2018, 8:20 AM

## 2018-12-01 NOTE — Progress Notes (Signed)
[facility] [facility] [place] [facility]   PATIENT NAME: Ryan CoonFrederick Harrison    MR#:  [id number]  DATE OF BIRTH:  03/02/1961  SUBJECTIVE:  CHIEF COMPLAINT:   Chief Complaint  Patient presents with  . Cough  . Wheezing  . Headache   Patient is resting comfortably denies any shortness of breath but feels tight in his chest and reporting shortness of breath with minimal exertion  REVIEW OF SYSTEMS:  ROS Constitutional: Negative for chills and fever.  HENT: Negative for congestion and sore throat.   Eyes: Negative for blurred vision and double vision.  Respiratory: Positive for cough, shortness of breath with exertion and wheezing.   Cardiovascular: Negative for chest pain, palpitations and leg swelling.  Gastrointestinal: Negative for nausea and vomiting.  Genitourinary: Negative for dysuria and urgency.  Musculoskeletal: Negative for back pain and neck pain.  Neurological: Negative for dizziness and headaches.  Psychiatric/Behavioral: Negative for depression. The patient is not nervous/anxious.   DRUG ALLERGIES:   Allergies  Allergen Reactions  . Benadryl [Diphenhydramine]   . Codeine Hives  . Penicillins Hives   VITALS:  Blood pressure 133/82, pulse 99, temperature 99.1 F (37.3 C), temperature source Oral, resp. rate 16, height 6\' 2"  (1.88 m), weight 85.7 kg, SpO2 91 %. PHYSICAL EXAMINATION:  Physical Exam  GENERAL:  58 y.o.-year-old patient lying in the bed with no acute distress.  EYES: Pupils equal, round, reactive to light and accommodation. No scleral icterus. Extraocular muscles intact.  HEENT: Head atraumatic, normocephalic. Oropharynx and nasopharynx clear.  NECK:  Supple, no jugular venous distention. No thyroid enlargement, no tenderness.  LUNGS: Minimal Diffuse inspiratory and expiratory wheezing throughout all lung fields, no rales,rhonchi or crepitation. No use of accessory muscles of respiration. + Nasal cannula in place. CARDIOVASCULAR: RRR,  S1, S2 normal. No murmurs, rubs, or gallops.  ABDOMEN: Soft, nontender, nondistended. Bowel sounds present. No organomegaly or mass.  EXTREMITIES: No pedal edema, cyanosis, or clubbing.  NEUROLOGIC: Moving all extremities with no focal deficit.  Sensation intact. Gait not checked.  PSYCHIATRIC: The patient is alert and oriented x 3.  SKIN: No obvious rash, lesion, or ulcer.  LABORATORY PANEL:  Male CBC Recent Labs  Lab 12/01/18 0616  WBC 10.4  HGB 14.1  HCT 51.7  PLT 209   ------------------------------------------------------------------------------------------------------------------ Chemistries  Recent Labs  Lab 11/29/18 1317  12/01/18 0616  NA 139   < > 136  K 4.2   < > 5.5*  CL 99   < > 91*  CO2 31   < > 39*  GLUCOSE 105*   < > 132*  BUN 11   < > 22*  CREATININE 0.92   < > 0.91  CALCIUM 9.0   < > 8.8*  MG  --   --  2.4  AST 15  --   --   ALT 14  --   --   ALKPHOS 62  --   --   BILITOT 0.5  --   --    < > = values in this interval not displayed.   RADIOLOGY:  No results found. ASSESSMENT AND PLAN:   1. Acute hypoxic respiratory failure- secondary to asthma/COPD exacerbation.   Patient is requiring 2 L O2 in the ED.  Chest x-ray showed some possible pulmonary edema. -COVID negative -Continue IV Solu-Medrol, azithromycin, nebulizer treatments.  Patient was given a dose of IV Lasix x1.  Await clinical improvement BNP unremarkable at 63.  Follow-up on 2D echocardiogram to  evaluate cardiac function Wean off oxygen as tolerated  Check ambulatory pulse ox.  Will arrange home oxygen if patient meets criteria Echocardiogram done results are pending  2.  Hyperkalemia Repeat potassium level came back elevated at 6.4.-5.5 another dose of Lokelma and repeat a.m. labs Being treated with IV insulin, D50, calcium gluconate, sodium bicarb and Kayexalate. Follow-up on repeat potassium level.  Lokelma  3.  Hypertension Blood pressure controlled.  Norvasc was placed on hold  due to blood pressure being borderline on presentation  DVT prophylaxis; Lovenox   All the records are reviewed and case discussed with Care Management/Social Worker. Management plans discussed with the patient, family and they are in agreement.  CODE STATUS: Full Code  TOTAL TIME TAKING CARE OF THIS PATIENT: 35 minutes.   More than 50% of the time was spent in counseling/coordination of care: YES  POSSIBLE D/C IN 2 DAYS, DEPENDING ON CLINICAL CONDITION.   Nicholes Mango M.D on 12/01/2018 at 3:21 PM  Between 7am to 6pm - Pager - [phone]  After 6pm go to [email or web address] - Proofreader  [employer]  [facility]  Office  [phone]  CC: Primary care physician; System, Pcp Not In  Note: This dictation was prepared with Dragon dictation along with smaller phrase technology. Any transcriptional errors that result from this process are unintentional.

## 2018-12-02 LAB — POTASSIUM: Potassium: 5.2 mmol/L — ABNORMAL HIGH (ref 3.5–5.1)

## 2018-12-02 MED ORDER — IPRATROPIUM-ALBUTEROL 0.5-2.5 (3) MG/3ML IN SOLN
3.0000 mL | Freq: Three times a day (TID) | RESPIRATORY_TRACT | Status: DC
  Administered 2018-12-03 (×2): 3 mL via RESPIRATORY_TRACT
  Filled 2018-12-02 (×4): qty 3

## 2018-12-02 MED ORDER — METHYLPREDNISOLONE SODIUM SUCC 125 MG IJ SOLR
60.0000 mg | Freq: Three times a day (TID) | INTRAMUSCULAR | Status: DC
  Administered 2018-12-02 – 2018-12-03 (×3): 60 mg via INTRAVENOUS
  Filled 2018-12-02 (×3): qty 2

## 2018-12-02 MED ORDER — SODIUM ZIRCONIUM CYCLOSILICATE 10 G PO PACK
10.0000 g | PACK | Freq: Once | ORAL | Status: AC
  Administered 2018-12-02: 10 g via ORAL
  Filled 2018-12-02: qty 1

## 2018-12-02 NOTE — Progress Notes (Signed)
[facility] [place] [facility]   PATIENT NAME: Ryan CoonFrederick Harrison    MR#:  [id number]  DATE OF BIRTH:  08/31/1960  SUBJECTIVE:  CHIEF COMPLAINT:   Chief Complaint  Patient presents with  . Cough  . Wheezing  . Headache   Patient is wheezing diffusely and feeling tight in his chest today.  Intermittent episodes of cough  REVIEW OF SYSTEMS:  ROS Constitutional: Negative for chills and fever.  HENT: Negative for congestion and sore throat.   Eyes: Negative for blurred vision and double vision.  Respiratory: Positive for cough, shortness of breath with exertion and wheezing.   Cardiovascular: Negative for chest pain, palpitations and leg swelling.  Gastrointestinal: Negative for nausea and vomiting.  Genitourinary: Negative for dysuria and urgency.  Musculoskeletal: Negative for back pain and neck pain.  Neurological: Negative for dizziness and headaches.  Psychiatric/Behavioral: Negative for depression. The patient is not nervous/anxious.   DRUG ALLERGIES:   Allergies  Allergen Reactions  . Benadryl [Diphenhydramine]   . Codeine Hives  . Penicillins Hives   VITALS:  Blood pressure 133/85, pulse 82, temperature 98.4 F (36.9 C), temperature source Oral, resp. rate 20, height 6\' 2"  (1.88 m), weight 85.7 kg, SpO2 99 %. PHYSICAL EXAMINATION:  Physical Exam  GENERAL:  58 y.o.-year-old patient lying in the bed with no acute distress.  EYES: Pupils equal, round, reactive to light and accommodation. No scleral icterus. Extraocular muscles intact.  HEENT: Head atraumatic, normocephalic. Oropharynx and nasopharynx clear.  NECK:  Supple, no jugular venous distention. No thyroid enlargement, no tenderness.  LUNGS: Minimal Diffuse inspiratory and expiratory wheezing throughout all lung fields, no rales,rhonchi or crepitation. No use of accessory muscles of respiration. + Nasal cannula in place. CARDIOVASCULAR: RRR, S1, S2 normal. No murmurs, rubs, or gallops.   ABDOMEN: Soft, nontender, nondistended. Bowel sounds present. No organomegaly or mass.  EXTREMITIES: No pedal edema, cyanosis, or clubbing.  NEUROLOGIC: Moving all extremities with no focal deficit.  Sensation intact. Gait not checked.  PSYCHIATRIC: The patient is alert and oriented x 3.  SKIN: No obvious rash, lesion, or ulcer.  LABORATORY PANEL:  Male CBC Recent Labs  Lab 12/01/18 0616  WBC 10.4  HGB 14.1  HCT 51.7  PLT 209   ------------------------------------------------------------------------------------------------------------------ Chemistries  Recent Labs  Lab 11/29/18 1317  12/01/18 0616 12/02/18 0459  NA 139   < > 136  --   K 4.2   < > 5.5* 5.2*  CL 99   < > 91*  --   CO2 31   < > 39*  --   GLUCOSE 105*   < > 132*  --   BUN 11   < > 22*  --   CREATININE 0.92   < > 0.91  --   CALCIUM 9.0   < > 8.8*  --   MG  --   --  2.4  --   AST 15  --   --   --   ALT 14  --   --   --   ALKPHOS 62  --   --   --   BILITOT 0.5  --   --   --    < > = values in this interval not displayed.   RADIOLOGY:  No results found. ASSESSMENT AND PLAN:   1. Acute hypoxic respiratory failure- secondary to asthma/COPD exacerbation.   Patient is requiring 2 L O2 in the ED.  Chest x-ray showed some possible pulmonary edema. -  COVID negative -Continue IV Solu-Medrol, frequency of Solu-Medrol increased to 60 mg every 8 hours as patient is still wheezing today azithromycin, nebulizer treatments.  Patient was given a dose of IV Lasix x1.  Await clinical improvement BNP unremarkable at 63.   Follow-up on 2D echocardiogram -greater than 65% ejection fraction.  Cavity size was normal.  Mildly increased left ventricular wall thickness. Wean off oxygen as tolerated  Check ambulatory pulse ox.  Will arrange home oxygen if patient meets criteria Echocardiogram done results are pending  2.  Hyperkalemia Repeat potassium level came back elevated at 6.4.-5.5-5.2 another dose of Lokelma and repeat  a.m. labs Being treated with IV insulin, D50, calcium gluconate, sodium bicarb and Kayexalate. Follow-up on repeat potassium level.  Lokelma  3.  Hypertension Blood pressure controlled.  Norvasc was placed on hold due to blood pressure being borderline on presentation  DVT prophylaxis; Lovenox   All the records are reviewed and case discussed with Care Management/Social Worker. Management plans discussed with the patient, family and they are in agreement.  CODE STATUS: Full Code  TOTAL TIME TAKING CARE OF THIS PATIENT: 35 minutes.   More than 50% of the time was spent in counseling/coordination of care: YES  POSSIBLE D/C IN 2 DAYS, DEPENDING ON CLINICAL CONDITION.   Nicholes Mango M.D on 12/02/2018 at 4:18 PM  Between 7am to 6pm - Pager - [phone]  After 6pm go to [email or web address] - Proofreader  [facility] [place] [facility]  Office  [phone]  CC: Primary care physician; System, Pcp Not In  Note: This dictation was prepared with Dragon dictation along with smaller phrase technology. Any transcriptional errors that result from this process are unintentional.

## 2018-12-02 NOTE — Plan of Care (Signed)
Patient still having some wheezing today.  He ambulated this evening in the hallway with oxygen, half way through he felt short of breath.  We stopped O2 was 87% he rested for a few seconds and it went right up to 92%.  No significant changes.

## 2018-12-03 LAB — POTASSIUM: Potassium: 4.8 mmol/L (ref 3.5–5.1)

## 2018-12-03 MED ORDER — IPRATROPIUM BROMIDE HFA 17 MCG/ACT IN AERS: 1.0000 | INHALATION_SPRAY | Freq: Four times a day (QID) | RESPIRATORY_TRACT | 0 refills | Status: AC | PRN

## 2018-12-03 MED ORDER — PREDNISONE 10 MG PO TABS: ORAL_TABLET | ORAL | 0 refills | Status: AC

## 2018-12-03 MED ORDER — ALBUTEROL SULFATE HFA 108 (90 BASE) MCG/ACT IN AERS: 2.0000 | INHALATION_SPRAY | Freq: Four times a day (QID) | RESPIRATORY_TRACT | 0 refills | Status: AC | PRN

## 2018-12-03 NOTE — Plan of Care (Signed)
  Problem: Education: Goal: Knowledge of General Education information will improve Description: Including pain rating scale, medication(s)/side effects and non-pharmacologic comfort measures 12/03/2018 1723 by Myles Rosenthal, Cory Roughen, RN Outcome: Adequate for Discharge 12/03/2018 1425 by Myles Rosenthal, Cory Roughen, RN Outcome: Progressing   Problem: Health Behavior/Discharge Planning: Goal: Ability to manage health-related needs will improve 12/03/2018 1723 by Myles Rosenthal, Cory Roughen, RN Outcome: Adequate for Discharge 12/03/2018 1425 by Myles Rosenthal, Cory Roughen, RN Outcome: Progressing   Problem: Clinical Measurements: Goal: Ability to maintain clinical measurements within normal limits will improve 12/03/2018 1723 by Myles Rosenthal, Cory Roughen, RN Outcome: Adequate for Discharge 12/03/2018 1425 by Myles Rosenthal, Cory Roughen, RN Outcome: Progressing Goal: Will remain free from infection 12/03/2018 1723 by Myles Rosenthal, Cory Roughen, RN Outcome: Adequate for Discharge 12/03/2018 1425 by Myles Rosenthal, Cory Roughen, RN Outcome: Progressing Goal: Diagnostic test results will improve 12/03/2018 1723 by Ronna Polio, RN Outcome: Adequate for Discharge 12/03/2018 1425 by Myles Rosenthal, Cory Roughen, RN Outcome: Progressing Goal: Respiratory complications will improve 12/03/2018 1723 by Ronna Polio, RN Outcome: Adequate for Discharge 12/03/2018 1425 by Myles Rosenthal, Cory Roughen, RN Outcome: Progressing Goal: Cardiovascular complication will be avoided 12/03/2018 1723 by Ronna Polio, RN Outcome: Adequate for Discharge 12/03/2018 1425 by Ronna Polio, RN Outcome: Progressing   Problem: Activity: Goal: Risk for activity intolerance will decrease 12/03/2018 1723 by Myles Rosenthal, Cory Roughen, RN Outcome: Adequate for Discharge 12/03/2018 1425 by Myles Rosenthal, Cory Roughen, RN Outcome: Progressing

## 2018-12-03 NOTE — Discharge Summary (Signed)
[facility] [place] [facility]   PATIENT NAME: Ryan CoonFrederick Simonetti    MR#:  [id number]  DATE OF BIRTH:  11/27/1960  DATE OF ADMISSION:  11/29/2018   ADMITTING PHYSICIAN: Campbell StallKaty Dodd Mayo, MD  DATE OF DISCHARGE: 12/03/2018  PRIMARY CARE PHYSICIAN: System, Pcp Not In   ADMISSION DIAGNOSIS:  COPD exacerbation (HCC) [J44.1] Acute respiratory failure with hypoxia (HCC) [J96.01] DISCHARGE DIAGNOSIS:  Active Problems:   Acute respiratory failure with hypoxia (HCC)  SECONDARY DIAGNOSIS:   Past Medical History:  Diagnosis Date  . Asthma    HOSPITAL COURSE:  Chief complaint; Cough and wheezing and shortness of breath  History of presenting complaint; Ryan CoonFrederick Harrison  is a 58 y.o. male with a known history of asthma and COPD who presented to the ED with shortness of breath.  Patient states he developed a cough 2 days ago.   In the ED, he was initially hypoxic to 78% on room air.  He was placed on 2 L O2 by nasal cannula and his O2 sats improved.    Patient was diagnosed with acute hypoxic respiratory failure secondary to COPD exacerbation.    Hospital course; 1. Acute hypoxic respiratory failure-secondary to asthma/COPD exacerbation. Patient treated with IV salmeterol which was gradually tapered to p.o. prednisone.  Completed treatment duration with azithromycin.  Nebulizer treatments.  Was also given a dose of Lasix due to concern of mild pulmonary edema on chest x-ray.  Patient has significantly improved clinically.  BNP was unremarkable at 63.  Home oxygen therapy evaluation done this morning and patient qualifies for home oxygen therapy.  Patient reported being on oxygen therapy in the past and was subsequently taken off.  Patient requires continuous oxygen going forward.  Patient to follow-up with his primary care physician when he returns back home.  [place]. Follow-up on 2D echocardiogram -greater than 65% ejection fraction.  Cavity size was  normal.  Mildly increased left ventricular wall thickness.  2.  Hyperkalemia Treated and resolved.  3.  Hypertension Blood pressure controlled but gradually trending up.  Resumed home dose of Norvasc.  DISCHARGE CONDITIONS:  Stable CONSULTS OBTAINED:   DRUG ALLERGIES:   Allergies  Allergen Reactions  . Benadryl [Diphenhydramine]   . Codeine Hives  . Penicillins Hives   DISCHARGE MEDICATIONS:   Allergies as of 12/03/2018      Reactions   Benadryl [diphenhydramine]    Codeine Hives   Penicillins Hives      Medication List    TAKE these medications   albuterol 108 (90 Base) MCG/ACT inhaler Commonly known as: VENTOLIN HFA Inhale 2 puffs into the lungs every 6 (six) hours as needed for wheezing or shortness of breath.   amLODipine 5 MG tablet Commonly known as: NORVASC Take 5 mg by mouth daily.   brimonidine 0.2 % ophthalmic solution Commonly known as: ALPHAGAN INSTILL ONE DROP IN THE LEFT EYE TWICE A DAY   ipratropium 17 MCG/ACT inhaler Commonly known as: ATROVENT HFA Inhale 1 puff into the lungs every 6 (six) hours as needed for wheezing.   montelukast 10 MG tablet Commonly known as: SINGULAIR Take 10 mg by mouth every evening.   predniSONE 10 MG tablet Commonly known as: DELTASONE 40 mg p.o. daily x2 days, Then 30 mg p.o. daily x2 days Then 20 mg p.o. daily x2 days Then 10 mg p.o. daily x2 days   Simbrinza 1-0.2 % Susp Generic drug: Brinzolamide-Brimonidine INSTILL 1 DROP IN THE RIGHT EYE TWICE  A DAY AS DIRECTED   Travoprost (BAK Free) 0.004 % Soln ophthalmic solution Commonly known as: TRAVATAN Place 1 drop into the right eye at bedtime.   Trelegy Ellipta 100-62.5-25 MCG/INH Aepb Generic drug: Fluticasone-Umeclidin-Vilant Inhale 1 puff into the lungs daily.            Durable Medical Equipment  (From admission, onward)         Start     Ordered   12/03/18 1216  For home use only DME oxygen  Once    Question Answer Comment  Length of  Need Lifetime   Mode or (Route) Nasal cannula   Liters per Minute 2   Frequency Continuous (stationary and portable oxygen unit needed)   Oxygen conserving device Yes   Oxygen delivery system Gas      12/03/18 1216           DISCHARGE INSTRUCTIONS:   DIET:  Cardiac diet DISCHARGE CONDITION:  Stable ACTIVITY:  Activity as tolerated OXYGEN:  Home Oxygen: Yes.    Oxygen Delivery: Oxygen via nasal cannula at 2 L DISCHARGE LOCATION:  home   If you experience worsening of your admission symptoms, develop shortness of breath, life threatening emergency, suicidal or homicidal thoughts you must seek medical attention immediately by calling 911 or calling your MD immediately  if symptoms less severe.  You Must read complete instructions/literature along with all the possible adverse reactions/side effects for all the Medicines you take and that have been prescribed to you. Take any new Medicines after you have completely understood and accpet all the possible adverse reactions/side effects.   Please note  You were cared for by a hospitalist during your hospital stay. If you have any questions about your discharge medications or the care you received while you were in the hospital after you are discharged, you can call the unit and asked to speak with the hospitalist on call if the hospitalist that took care of you is not available. Once you are discharged, your primary care physician will handle any further medical issues. Please note that NO REFILLS for any discharge medications will be authorized once you are discharged, as it is imperative that you return to your primary care physician (or establish a relationship with a primary care physician if you do not have one) for your aftercare needs so that they can reassess your need for medications and monitor your lab values.    On the day of Discharge:  VITAL SIGNS:  Blood pressure (!) 141/87, pulse 82, temperature 98.3 F (36.8 C),  temperature source Oral, resp. rate 18, height 6\' 2"  (1.88 m), weight 85.7 kg, SpO2 93 %. PHYSICAL EXAMINATION:  GENERAL:  58 y.o.-year-old patient lying in the bed with no acute distress.  EYES: Pupils equal, round, reactive to light and accommodation. No scleral icterus. Extraocular muscles intact.  HEENT: Head atraumatic, normocephalic. Oropharynx and nasopharynx clear.  NECK:  Supple, no jugular venous distention. No thyroid enlargement, no tenderness.  LUNGS: Normal breath sounds bilaterally, no wheezing, rales,rhonchi or crepitation. No use of accessory muscles of respiration.  CARDIOVASCULAR: S1, S2 normal. No murmurs, rubs, or gallops.  ABDOMEN: Soft, non-tender, non-distended. Bowel sounds present. No organomegaly or mass.  EXTREMITIES: No pedal edema, cyanosis, or clubbing.  NEUROLOGIC: Cranial nerves II through XII are intact. Muscle strength 5/5 in all extremities. Sensation intact. Gait not checked.  PSYCHIATRIC: The patient is alert and oriented x 3.  SKIN: No obvious rash, lesion, or ulcer.  DATA REVIEW:  CBC Recent Labs  Lab 12/01/18 0616  WBC 10.4  HGB 14.1  HCT 51.7  PLT 209    Chemistries  Recent Labs  Lab 11/29/18 1317  12/01/18 0616  12/03/18 0337  NA 139   < > 136  --   --   K 4.2   < > 5.5*   < > 4.8  CL 99   < > 91*  --   --   CO2 31   < > 39*  --   --   GLUCOSE 105*   < > 132*  --   --   BUN 11   < > 22*  --   --   CREATININE 0.92   < > 0.91  --   --   CALCIUM 9.0   < > 8.8*  --   --   MG  --   --  2.4  --   --   AST 15  --   --   --   --   ALT 14  --   --   --   --   ALKPHOS 62  --   --   --   --   BILITOT 0.5  --   --   --   --    < > = values in this interval not displayed.     Microbiology Results  Results for orders placed or performed during the hospital encounter of 11/29/18  SARS Coronavirus 2 [facility] order, Performed in [facility] hospital lab) Nasopharyngeal Nasopharyngeal Swab     Status: None   Collection Time: 11/29/18  2:13  PM   Specimen: Nasopharyngeal Swab  Result Value Ref Range Status   SARS Coronavirus 2 NEGATIVE NEGATIVE Final    Comment: (NOTE) If result is NEGATIVE SARS-CoV-2 target nucleic acids are NOT DETECTED. The SARS-CoV-2 RNA is generally detectable in upper and lower  respiratory specimens during the acute phase of infection. The lowest  concentration of SARS-CoV-2 viral copies this assay can detect is 250  copies / mL. A negative result does not preclude SARS-CoV-2 infection  and should not be used as the sole basis for treatment or other  patient management decisions.  A negative result may occur with  improper specimen collection / handling, submission of specimen other  than nasopharyngeal swab, presence of viral mutation(s) within the  areas targeted by this assay, and inadequate number of viral copies  (<250 copies / mL). A negative result must be combined with clinical  observations, patient history, and epidemiological information. If result is POSITIVE SARS-CoV-2 target nucleic acids are DETECTED. The SARS-CoV-2 RNA is generally detectable in upper and lower  respiratory specimens dur ing the acute phase of infection.  Positive  results are indicative of active infection with SARS-CoV-2.  Clinical  correlation with patient history and other diagnostic information is  necessary to determine patient infection status.  Positive results do  not rule out bacterial infection or co-infection with other viruses. If result is PRESUMPTIVE POSTIVE SARS-CoV-2 nucleic acids MAY BE PRESENT.   A presumptive positive result was obtained on the submitted specimen  and confirmed on repeat testing.  While 2019 novel coronavirus  (SARS-CoV-2) nucleic acids may be present in the submitted sample  additional confirmatory testing may be necessary for epidemiological  and / or clinical management purposes  to differentiate between  SARS-CoV-2 and other Sarbecovirus currently known to infect humans.   If clinically indicated additional testing with an alternate test  methodology [phone]([id number]) is advised. The SARS-CoV-2 RNA is generally  detectable in upper and lower respiratory sp ecimens during the acute  phase of infection. The expected result is Negative. Fact Sheet for Patients:  BoilerBrush.com.cyhttps://www.fda.gov/media/136312/download Fact Sheet for Healthcare Providers: https://pope.com/https://www.fda.gov/media/136313/download This test is not yet approved or cleared by the [place] FDA and has been authorized for detection and/or diagnosis of SARS-CoV-2 by FDA under an Emergency Use Authorization (EUA).  This EUA will remain in effect (meaning this test can be used) for the duration of the COVID-19 declaration under Section 564(b)(1) of the Act, 21 U.S.C. section 360bbb-3(b)(1), unless the authorization is terminated or revoked sooner. Performed at [facility], [address] [place], [place] [address]     RADIOLOGY:  No results found.   Management plans discussed with the patient, family and they are in agreement.  CODE STATUS: Full Code   TOTAL TIME TAKING CARE OF THIS PATIENT: 38 minutes.    Felicite Zeimet M.D on 12/03/2018 at 12:48 PM  Between 7am to 6pm - Pager - [phone]  After 6pm go to [email or web address] - Social research officer, governmentpassword EPAS [email or web address]  [facility] [place] [facility]  Office  [phone]  CC: Primary care physician; System, Pcp Not In   Note: This dictation was prepared with Dragon dictation along with smaller phrase technology. Any transcriptional errors that result from this process are unintentional.

## 2018-12-03 NOTE — TOC Initial Note (Signed)
Transition of Care [facility](TOC) - Initial/Assessment Note    Patient Details  Name: Ryan Harrison MRN: [id number] Date of Birth: 11/25/1960  Transition of [facility](TOC) CM/SW Contact:    Chapman FitchBOWEN, Nakoma Gotwalt T, RN Phone Number: 12/03/2018, 9:49 AM  Clinical Narrative:                   Expected Discharge Plan: Home/Self Care     Patient Goals and CMS Choice    Patient admitted for acute respiratory failure. 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   Patient states that he had some issues with his truck, and it is currently at the [employer] dealer in [place].  Patient is listed has a self pay.Patient states he has blue cross blue shield, with medication coverage  Patient states that he lives at home with his mother.  If followed by PCP and pulmonology in [place].  Patient states that earlier this year he was set up with continuous O2, however in May 2020 his pulmonologist determined it was no longer needed, and equipment was turned back in.  Patient is requiring acute O2.  Bedside RN to check ambulating sats.  Will Set up home O2 if indicated.  Per Nida BoatmanBrad with [employer] they would be able to accommodate patients needs.   Patient states that his friend is also a Sports administratortruck diver and lives in [place].  He will be picking him up at discharge, and taking him back to his truck in [place]    Expected Discharge Plan and Services Expected Discharge Plan: Home/Self Care   Discharge Planning Services: CM Consult   Living arrangements for the past 2 months: Single Family Home Expected Discharge Date: 12/01/18                                    Prior Living Arrangements/Services Living arrangements for the past 2 months: Single Family Home Lives with:: Parents Patient language and need for interpreter reviewed:: Yes Do you feel safe going back to the place where you live?: Yes      Need for Family Participation in Patient Care: No (Comment) Care giver support system in place?:  Yes (comment)   Criminal Activity/Legal Involvement Pertinent to Current Situation/Hospitalization: No - Comment as needed  Activities of Daily Living Home Assistive Devices/Equipment: Nebulizer ADL Screening (condition at time of admission) Patient's cognitive ability adequate to safely complete daily activities?: No Is the patient deaf or have difficulty hearing?: No Does the patient have difficulty seeing, even when wearing glasses/contacts?: No Does the patient have difficulty concentrating, remembering, or making decisions?: No Patient able to express need for assistance with ADLs?: Yes Does the patient have difficulty dressing or bathing?: No Independently performs ADLs?: Yes (appropriate for developmental age) Does the patient have difficulty walking or climbing stairs?: No Weakness of Legs: None Weakness of Arms/Hands: None  Permission Sought/Granted                  Emotional Assessment Appearance:: Appears stated age Attitude/Demeanor/Rapport: Gracious Affect (typically observed): Accepting Orientation: : Oriented to Self, Oriented to Place, Oriented to  Time, Oriented to Situation   Psych Involvement: No (comment)  Admission diagnosis:  COPD exacerbation (HCC) [J44.1] Acute respiratory failure with hypoxia (HCC) [J96.01] Patient Active Problem List   Diagnosis Date Noted  . Acute respiratory failure with hypoxia (HCC) 11/29/2018   PCP:  System, Pcp Not In Pharmacy:   [facility]  [place], [place] [address] [place] [address] Phone: [phone] Fax: [phone]     Social Determinants of Health (SDOH) Interventions    Readmission Risk Interventions No flowsheet data found.

## 2018-12-03 NOTE — TOC Transition Note (Addendum)
Transition of Care [facility]) - CM/SW Discharge Note   Patient Details  Name: Ryan Harrison MRN: [id number] Date of Birth: [birth year]  Transition of Care [facility]) CM/SW Contact:  Beverly Sessions, RN Phone Number: 12/03/2018, 2:38 PM   Clinical Narrative:     Patient to discharge home today.   Brad with [employer] to deliver portable O2 prior to discharge.  Patient to contact [employer] to obtain his insurance number to provide to [place]  Friend to transport at discharge back to [place] to pick up his tractor trailer  5:00pm  CSW spoke to patient, he is stated he left his insurance card in his truck.  Patient gave CSW the name of the pharmacy that he uses, CSW contacted the pharmacy [facility] [phone] pharmacist said the only number they have in their records is [facility] of [place] [phone].  CSW provided this number to DME equipment rep Brad.  Leroy Sea said he is going to try to run his insurance.  Patient was offered to have to pay privately, or to go AMA, and not have oxygen.  Patient agreed to pay privately for oxygen, and to try to get reimbursed from his insurance company.  DME representative spoke to patient and gave him DME contact info for patient to send insurance information once he is able to get access to his card again.  Patient discharging today with friend.  Final next level of care: Home/Self Care Barriers to Discharge: Barriers Resolved   Patient Goals and CMS Choice        Discharge Placement                       Discharge Plan and Services   Discharge Planning Services: CM Consult            DME Arranged: Oxygen DME Agency: [facility] Date DME Agency Contacted: 12/03/18 Time DME Agency Contacted: 1100 Representative spoke with at DME Agency: Leroy Sea            Social Determinants of Health ([place]) Interventions     Readmission Risk Interventions No flowsheet data found.   Updated Jones Broom. Bell Buckle, MSW, [place]   12/03/2018 5:08 PM

## 2018-12-03 NOTE — Plan of Care (Signed)
The patient will need oxygen supply for discharged. The patient has been stable. Ambulated in the hallway with the nurse.  Problem: Education: Goal: Knowledge of General Education information will improve Description: Including pain rating scale, medication(s)/side effects and non-pharmacologic comfort measures Outcome: Progressing   Problem: Health Behavior/Discharge Planning: Goal: Ability to manage health-related needs will improve Outcome: Progressing   Problem: Clinical Measurements: Goal: Ability to maintain clinical measurements within normal limits will improve Outcome: Progressing Goal: Will remain free from infection Outcome: Progressing Goal: Diagnostic test results will improve Outcome: Progressing Goal: Respiratory complications will improve Outcome: Progressing Goal: Cardiovascular complication will be avoided Outcome: Progressing   Problem: Activity: Goal: Risk for activity intolerance will decrease Outcome: Progressing   Problem: Nutrition: Goal: Adequate nutrition will be maintained Outcome: Progressing   Problem: Coping: Goal: Level of anxiety will decrease Outcome: Progressing   Problem: Elimination: Goal: Will not experience complications related to bowel motility Outcome: Progressing Goal: Will not experience complications related to urinary retention Outcome: Progressing   Problem: Pain Managment: Goal: General experience of comfort will improve Outcome: Progressing   Problem: Safety: Goal: Ability to remain free from injury will improve Outcome: Progressing   Problem: Skin Integrity: Goal: Risk for impaired skin integrity will decrease Outcome: Progressing

## 2018-12-03 NOTE — Sepsis Progress Note (Signed)
SATURATION QUALIFICATIONS: (This note is used to comply with regulatory documentation for home oxygen)  Patient Saturations on Room Air at Rest = 89%  Patient Saturations on Room Air while Ambulating = 86%  Patient Saturations on 2 Liters of oxygen while Ambulating = 96%  Please briefly explain why patient needs home oxygen:
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
